# Patient Record
Sex: Male | Born: 1995 | Race: White | Hispanic: No | Smoking: Current every day smoker
Health system: Southern US, Community
[De-identification: ages and names within clinical notes are randomized; demographics above are authoritative.]

## PROBLEM LIST (undated history)

## (undated) DIAGNOSIS — G40909 Epilepsy, unspecified, not intractable, without status epilepticus: Secondary | ICD-10-CM

## (undated) DIAGNOSIS — Z720 Tobacco use: Secondary | ICD-10-CM

## (undated) HISTORY — PX: SHOULDER SURGERY: SHX246

## (undated) HISTORY — PX: KNEE SURGERY: SHX244

---

## 2020-10-17 DIAGNOSIS — C9210 Chronic myeloid leukemia, BCR/ABL-positive, not having achieved remission: Secondary | ICD-10-CM | POA: Insufficient documentation

## 2020-11-18 DIAGNOSIS — G40909 Epilepsy, unspecified, not intractable, without status epilepticus: Secondary | ICD-10-CM

## 2020-11-18 DIAGNOSIS — R9089 Other abnormal findings on diagnostic imaging of central nervous system: Secondary | ICD-10-CM | POA: Insufficient documentation

## 2021-04-01 ENCOUNTER — Other Ambulatory Visit: Payer: Self-pay

## 2021-04-01 ENCOUNTER — Emergency Department (HOSPITAL_COMMUNITY): Payer: 59

## 2021-04-01 ENCOUNTER — Encounter (HOSPITAL_COMMUNITY): Payer: Self-pay | Admitting: Adult Health

## 2021-04-01 ENCOUNTER — Observation Stay (HOSPITAL_COMMUNITY)
Admission: EM | Admit: 2021-04-01 | Discharge: 2021-04-01 | Disposition: A | Discharge status: Home / Self Care | Payer: 59 | Attending: Internal Medicine | Admitting: Internal Medicine

## 2021-04-01 DIAGNOSIS — R569 Unspecified convulsions: Secondary | ICD-10-CM | POA: Diagnosis present

## 2021-04-01 DIAGNOSIS — Z79899 Other long term (current) drug therapy: Secondary | ICD-10-CM | POA: Diagnosis not present

## 2021-04-01 DIAGNOSIS — Z20822 Contact with and (suspected) exposure to covid-19: Secondary | ICD-10-CM | POA: Diagnosis not present

## 2021-04-01 DIAGNOSIS — G40909 Epilepsy, unspecified, not intractable, without status epilepticus: Secondary | ICD-10-CM | POA: Diagnosis not present

## 2021-04-01 DIAGNOSIS — E872 Acidosis, unspecified: Secondary | ICD-10-CM | POA: Insufficient documentation

## 2021-04-01 DIAGNOSIS — Z880 Allergy status to penicillin: Secondary | ICD-10-CM | POA: Diagnosis not present

## 2021-04-01 DIAGNOSIS — N179 Acute kidney failure, unspecified: Secondary | ICD-10-CM | POA: Diagnosis not present

## 2021-04-01 DIAGNOSIS — Y900 Blood alcohol level of less than 20 mg/100 ml: Secondary | ICD-10-CM | POA: Diagnosis not present

## 2021-04-01 DIAGNOSIS — R739 Hyperglycemia, unspecified: Secondary | ICD-10-CM | POA: Insufficient documentation

## 2021-04-01 DIAGNOSIS — G40901 Epilepsy, unspecified, not intractable, with status epilepticus: Secondary | ICD-10-CM | POA: Diagnosis not present

## 2021-04-01 DIAGNOSIS — C9210 Chronic myeloid leukemia, BCR/ABL-positive, not having achieved remission: Secondary | ICD-10-CM | POA: Diagnosis not present

## 2021-04-01 DIAGNOSIS — J9601 Acute respiratory failure with hypoxia: Secondary | ICD-10-CM | POA: Diagnosis not present

## 2021-04-01 DIAGNOSIS — Z886 Allergy status to analgesic agent status: Secondary | ICD-10-CM | POA: Insufficient documentation

## 2021-04-01 DIAGNOSIS — J9600 Acute respiratory failure, unspecified whether with hypoxia or hypercapnia: Secondary | ICD-10-CM

## 2021-04-01 HISTORY — DX: Tobacco use: Z72.0

## 2021-04-01 HISTORY — DX: Epilepsy, unspecified, not intractable, without status epilepticus: G40.909

## 2021-04-01 LAB — CBC WITH DIFFERENTIAL/PLATELET
Abs Immature Granulocytes: 0.52 10*3/uL — ABNORMAL HIGH (ref 0.00–0.07)
Basophils Absolute: 0.1 10*3/uL (ref 0.0–0.1)
Basophils Relative: 1 %
Eosinophils Absolute: 0.3 10*3/uL (ref 0.0–0.5)
Eosinophils Relative: 2 %
HCT: 43.5 % (ref 39.0–52.0)
Hemoglobin: 13.9 g/dL (ref 13.0–17.0)
Immature Granulocytes: 3 %
Lymphocytes Relative: 39 %
Lymphs Abs: 6.9 10*3/uL — ABNORMAL HIGH (ref 0.7–4.0)
MCH: 32.6 pg (ref 26.0–34.0)
MCHC: 32 g/dL (ref 30.0–36.0)
MCV: 102.1 fL — ABNORMAL HIGH (ref 80.0–100.0)
Monocytes Absolute: 1.1 10*3/uL — ABNORMAL HIGH (ref 0.1–1.0)
Monocytes Relative: 6 %
Neutro Abs: 8.6 10*3/uL — ABNORMAL HIGH (ref 1.7–7.7)
Neutrophils Relative %: 49 %
Platelets: 314 10*3/uL (ref 150–400)
RBC: 4.26 MIL/uL (ref 4.22–5.81)
RDW: 13 % (ref 11.5–15.5)
WBC: 17.5 10*3/uL — ABNORMAL HIGH (ref 4.0–10.5)
nRBC: 0 % (ref 0.0–0.2)

## 2021-04-01 LAB — COMPREHENSIVE METABOLIC PANEL
ALT: 20 U/L (ref 0–44)
AST: 34 U/L (ref 15–41)
Albumin: 4.7 g/dL (ref 3.5–5.0)
Alkaline Phosphatase: 68 U/L (ref 38–126)
Anion gap: 21 — ABNORMAL HIGH (ref 5–15)
BUN: 9 mg/dL (ref 6–20)
CO2: 14 mmol/L — ABNORMAL LOW (ref 22–32)
Calcium: 9 mg/dL (ref 8.9–10.3)
Chloride: 101 mmol/L (ref 98–111)
Creatinine, Ser: 1.47 mg/dL — ABNORMAL HIGH (ref 0.61–1.24)
GFR, Estimated: >60 mL/min (ref >60)
Glucose, Bld: 260 mg/dL — ABNORMAL HIGH (ref 70–99)
Potassium: 4.2 mmol/L (ref 3.5–5.1)
Sodium: 136 mmol/L (ref 135–145)
Total Bilirubin: 0.4 mg/dL (ref 0.3–1.2)
Total Protein: 7.5 g/dL (ref 6.5–8.1)

## 2021-04-01 LAB — I-STAT ARTERIAL BLOOD GAS, ED
Acid-base deficit: 3 mmol/L — ABNORMAL HIGH (ref 0.0–2.0)
Bicarbonate: 23.9 mmol/L (ref 20.0–28.0)
Calcium, Ion: 1.2 mmol/L (ref 1.15–1.40)
HCT: 37 % — ABNORMAL LOW (ref 39.0–52.0)
Hemoglobin: 12.6 g/dL — ABNORMAL LOW (ref 13.0–17.0)
O2 Saturation: 100 %
Patient temperature: 98.5
Potassium: 4.3 mmol/L (ref 3.5–5.1)
Sodium: 139 mmol/L (ref 135–145)
TCO2: 25 mmol/L (ref 22–32)
pCO2 arterial: 49.1 mmHg — ABNORMAL HIGH (ref 32.0–48.0)
pH, Arterial: 7.295 — ABNORMAL LOW (ref 7.350–7.450)
pO2, Arterial: 530 mmHg — ABNORMAL HIGH (ref 83.0–108.0)

## 2021-04-01 LAB — LACTIC ACID, PLASMA
Lactic Acid, Venous: >11 mmol/L (ref 0.5–1.9)
Lactic Acid, Venous: 5.3 mmol/L (ref 0.5–1.9)

## 2021-04-01 LAB — RAPID URINE DRUG SCREEN, HOSP PERFORMED
Amphetamines: NOT DETECTED
Barbiturates: NOT DETECTED
Benzodiazepines: POSITIVE — AB
Cocaine: NOT DETECTED
Opiates: NOT DETECTED
Tetrahydrocannabinol: POSITIVE — AB

## 2021-04-01 LAB — I-STAT CHEM 8, ED
BUN: 10 mg/dL (ref 6–20)
Calcium, Ion: 1.15 mmol/L (ref 1.15–1.40)
Chloride: 105 mmol/L (ref 98–111)
Creatinine, Ser: 1.2 mg/dL (ref 0.61–1.24)
Glucose, Bld: 254 mg/dL — ABNORMAL HIGH (ref 70–99)
HCT: 42 % (ref 39.0–52.0)
Hemoglobin: 14.3 g/dL (ref 13.0–17.0)
Potassium: 4.2 mmol/L (ref 3.5–5.1)
Sodium: 138 mmol/L (ref 135–145)
TCO2: 16 mmol/L — ABNORMAL LOW (ref 22–32)

## 2021-04-01 LAB — VITAMIN B12: Vitamin B-12: 274 pg/mL (ref 180–914)

## 2021-04-01 LAB — PHOSPHORUS: Phosphorus: 7 mg/dL — ABNORMAL HIGH (ref 2.5–4.6)

## 2021-04-01 LAB — PHENYTOIN LEVEL, TOTAL: Phenytoin Lvl: <2.5 ug/mL — ABNORMAL LOW (ref 10.0–20.0)

## 2021-04-01 LAB — MAGNESIUM: Magnesium: 2.2 mg/dL (ref 1.7–2.4)

## 2021-04-01 LAB — CBG MONITORING, ED: Glucose-Capillary: 223 mg/dL — ABNORMAL HIGH (ref 70–99)

## 2021-04-01 LAB — RESP PANEL BY RT-PCR (FLU A&B, COVID) ARPGX2
Influenza A by PCR: NEGATIVE
Influenza B by PCR: NEGATIVE
SARS Coronavirus 2 by RT PCR: NEGATIVE

## 2021-04-01 LAB — CK: Total CK: 258 U/L (ref 49–397)

## 2021-04-01 LAB — ETHANOL: Alcohol, Ethyl (B): <10 mg/dL (ref <10)

## 2021-04-01 LAB — VALPROIC ACID LEVEL: Valproic Acid Lvl: <10 ug/mL — ABNORMAL LOW (ref 50.0–100.0)

## 2021-04-01 MED ORDER — PROPOFOL 1000 MG/100ML IV EMUL
0.0000 ug/kg/min | INTRAVENOUS | Status: DC
Start: 2021-04-01 — End: 2021-04-01
  Administered 2021-04-01: 30 ug/kg/min via INTRAVENOUS
  Filled 2021-04-01: qty 100

## 2021-04-01 MED ORDER — SODIUM CHLORIDE 0.9 % IV SOLN
4000.0000 mg | Freq: Once | INTRAVENOUS | Status: AC
  Administered 2021-04-01: 4000 mg via INTRAVENOUS
  Filled 2021-04-01: qty 40

## 2021-04-01 MED ORDER — VITAMIN B-12 1000 MCG PO TABS: 1000.0000 ug | ORAL_TABLET | Freq: Every day | ORAL | Status: DC

## 2021-04-01 MED ORDER — SODIUM CHLORIDE 0.9 % IV SOLN
INTRAVENOUS | Status: DC
  Administered 2021-04-01: 1000 mL via INTRAVENOUS

## 2021-04-01 MED ORDER — HEPARIN SODIUM (PORCINE) 5000 UNIT/ML IJ SOLN: 5000.0000 [IU] | Freq: Three times a day (TID) | INTRAMUSCULAR | Status: DC

## 2021-04-01 MED ORDER — LEVETIRACETAM 500 MG PO TABS: 1500.0000 mg | ORAL_TABLET | Freq: Two times a day (BID) | ORAL | Status: DC

## 2021-04-01 MED ORDER — FENTANYL 2500MCG IN NS 250ML (10MCG/ML) PREMIX INFUSION
0.0000 ug/h | INTRAVENOUS | Status: DC
  Administered 2021-04-01: 50 ug/h via INTRAVENOUS
  Filled 2021-04-01: qty 250

## 2021-04-01 MED ORDER — LEVETIRACETAM IN NACL 1000 MG/100ML IV SOLN: 1000.0000 mg | Freq: Two times a day (BID) | INTRAVENOUS | Status: DC

## 2021-04-01 MED ORDER — ACETAMINOPHEN 325 MG PO TABS: 650.0000 mg | ORAL_TABLET | Freq: Four times a day (QID) | ORAL | Status: DC | PRN

## 2021-04-01 MED ORDER — FENTANYL CITRATE (PF) 100 MCG/2ML IJ SOLN
100.0000 ug | Freq: Once | INTRAMUSCULAR | Status: AC
Start: 2021-04-01 — End: 2021-04-01
  Administered 2021-04-01: 100 ug via INTRAVENOUS
  Filled 2021-04-01: qty 2

## 2021-04-01 MED ORDER — LORAZEPAM 2 MG/ML PO CONC: 4.0000 mg | Freq: Once | ORAL | Status: DC | PRN

## 2021-04-01 MED ORDER — ROCURONIUM BROMIDE 50 MG/5ML IV SOLN
INTRAVENOUS | Status: AC | PRN
  Administered 2021-04-01: 80 mg via INTRAVENOUS

## 2021-04-01 MED ORDER — SODIUM CHLORIDE 0.9 % IV BOLUS
1000.0000 mL | Freq: Once | INTRAVENOUS | Status: AC
  Administered 2021-04-01: 1000 mL via INTRAVENOUS

## 2021-04-01 MED ORDER — ACETAMINOPHEN 650 MG RE SUPP: 650.0000 mg | Freq: Four times a day (QID) | RECTAL | Status: DC | PRN

## 2021-04-01 MED ORDER — PROPOFOL 1000 MG/100ML IV EMUL
INTRAVENOUS | Status: DC | PRN
  Administered 2021-04-01: 30 ug/kg/min via INTRAVENOUS

## 2021-04-01 MED ORDER — ETOMIDATE 2 MG/ML IV SOLN
INTRAVENOUS | Status: AC | PRN
  Administered 2021-04-01: 20 mg via INTRAVENOUS

## 2021-04-01 MED ORDER — DEXMEDETOMIDINE HCL IN NACL 400 MCG/100ML IV SOLN
0.0000 ug/kg/h | INTRAVENOUS | Status: DC
  Administered 2021-04-01: 0.4 ug/kg/h via INTRAVENOUS
  Filled 2021-04-01: qty 100

## 2021-04-01 MED ORDER — THIAMINE HCL 100 MG/ML IJ SOLN
Freq: Once | INTRAVENOUS | Status: AC
  Filled 2021-04-01: qty 1000

## 2021-04-01 MED ORDER — LEVETIRACETAM 750 MG PO TABS: 1500.0000 mg | ORAL_TABLET | Freq: Two times a day (BID) | ORAL | 0 refills | Status: AC

## 2021-04-01 NOTE — Progress Notes (Signed)
Patient intubated by ED MD without complications.  Positive color change noted.  Bilateral breath sounds auscultated.  Chest xray pended for tube placement.  Sats and vitals currently stable.

## 2021-04-01 NOTE — ED Provider Notes (Signed)
[facility] EMERGENCY DEPARTMENT Provider Note   CSN: [id number] Arrival date & time: 04/01/21  4462  LEVEL 5 CAVEAT - UNRESPONSIVE  History Chief Complaint  Patient presents with  . Seizures    Bobby Mathis is a 25 y.o. male.  HPI 25 year old male presents via EMS with seizures.  History is from EMS only as the patient is unresponsive.  He works for [employer] but is from out of town.  He has a known seizure disorder but it is unclear what medicine he is on.  Has been seizing prior to EMS arrival for about 10 minutes and then another 10 minutes with EMS.  After 2 doses of 5 mg Versed he stopped seizing.  However then he started having further seizures and got another 5 mg.  He is no longer seizing but is being bagged.  He has been completely unresponsive since.  EMS describes as a generalized tonic-clonic seizure.  No obvious focality.  Past Medical History:  Diagnosis Date  . Seizure disorder ([place])   . Tobacco abuse     Patient Active Problem List   Diagnosis Date Noted  . Hyperglycemia 04/01/2021  . AKI (acute kidney injury) ([place]) 04/01/2021  . Metabolic acidosis 86/38/1771  . Status epilepticus ([place]) 04/01/2021  . Abnormal brain CT 11/18/2020  . Seizure disorder ([place]) 11/18/2020  . 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 No pertinent family history.     Home Medications Prior to Admission medications   Medication Sig Start Date End Date Taking? Authorizing Provider  levETIRAcetam (KEPPRA) 1000 MG tablet Take 1,000 mg by mouth in the morning and at bedtime.   Yes [provider]    Allergies    Penicillins and Amoxicillin-pot clavulanate  Review of Systems   Review of Systems  Unable to perform ROS: Patient unresponsive    Physical Exam Updated Vital Signs BP 115/69   Pulse 68   Temp 99.1 F (37.3 C)   Resp 18   Ht 5\' 9"  (1.753 m)   Wt 80 kg   SpO2 100%   BMI 26.05 kg/m   Physical  Exam Vitals and nursing note reviewed.  Constitutional:      Appearance: He is well-developed.  HENT:     Head: Normocephalic and atraumatic.     Right Ear: External ear normal.     Left Ear: External ear normal.     Nose: Nose normal.     Mouth/Throat:     Comments: Evidence of distal tongue injury Eyes:     General:        Right eye: No discharge.        Left eye: No discharge.     Pupils: Pupils are equal, round, and reactive to light.  Neck:     Comments: Passive ROM without stiffness Cardiovascular:     Rate and Rhythm: Regular rhythm. Tachycardia present.     Heart sounds: Normal heart sounds.  Pulmonary:     Comments: Being bagged by EMS. Coarse breath sounds bilaterally Abdominal:     General: There is no distension.     Palpations: Abdomen is soft.  Musculoskeletal:     Cervical back: Neck supple. No rigidity.  Skin:    General: Skin is warm and dry.  Neurological:     Mental Status: He is unresponsive.     Comments: Unresponsive. Being bagged by EMS. I cannot get any significant movement to pain  in extremities. No significant spontaneous movement. Does have intact corneal reflex. Has oral airway in with no gag currently.   Psychiatric:        Mood and Affect: Mood is not anxious.     ED Results / Procedures / Treatments   Labs (all labs ordered are listed, but only abnormal results are displayed) Labs Reviewed  LACTIC ACID, PLASMA - Abnormal; Notable for the following components:      Result Value   Lactic Acid, Venous >11.0 (*)    All other components within normal limits  LACTIC ACID, PLASMA - Abnormal; Notable for the following components:   Lactic Acid, Venous 5.3 (*)    All other components within normal limits  CBC WITH DIFFERENTIAL/PLATELET - Abnormal; Notable for the following components:   WBC 17.5 (*)    MCV 102.1 (*)    Neutro Abs 8.6 (*)    Lymphs Abs 6.9 (*)    Monocytes Absolute 1.1 (*)    Abs Immature Granulocytes 0.52 (*)    All other  components within normal limits  COMPREHENSIVE METABOLIC PANEL - Abnormal; Notable for the following components:   CO2 14 (*)    Glucose, Bld 260 (*)    Creatinine, Ser 1.47 (*)    Anion gap 21 (*)    All other components within normal limits  RAPID URINE DRUG SCREEN, HOSP PERFORMED - Abnormal; Notable for the following components:   Benzodiazepines POSITIVE (*)    Tetrahydrocannabinol POSITIVE (*)    All other components within normal limits  PHOSPHORUS - Abnormal; Notable for the following components:   Phosphorus 7.0 (*)    All other components within normal limits  PHENYTOIN LEVEL, TOTAL - Abnormal; Notable for the following components:   Phenytoin Lvl <2.5 (*)    All other components within normal limits  VALPROIC ACID LEVEL - Abnormal; Notable for the following components:   Valproic Acid Lvl <10 (*)    All other components within normal limits  I-STAT CHEM 8, ED - Abnormal; Notable for the following components:   Glucose, Bld 254 (*)    TCO2 16 (*)    All other components within normal limits  CBG MONITORING, ED - Abnormal; Notable for the following components:   Glucose-Capillary 223 (*)    All other components within normal limits  I-STAT ARTERIAL BLOOD GAS, ED - Abnormal; Notable for the following components:   pH, Arterial 7.295 (*)    pCO2 arterial 49.1 (*)    pO2, Arterial 530 (*)    Acid-base deficit 3.0 (*)    HCT 37.0 (*)    Hemoglobin 12.6 (*)    All other components within normal limits  RESP PANEL BY RT-PCR (FLU A&B, COVID) ARPGX2  ETHANOL  MAGNESIUM  CK  VITAMIN B12  LEVETIRACETAM LEVEL  URINALYSIS, ROUTINE W REFLEX MICROSCOPIC  BLOOD GAS, ARTERIAL  HIV ANTIBODY (ROUTINE TESTING W REFLEX)  TSH  T4, FREE    EKG None  Radiology CT Head Wo Contrast  Result Date: 04/01/2021 CLINICAL DATA:  Seizure.  Endotracheal intubation. EXAM: CT HEAD WITHOUT CONTRAST TECHNIQUE: Contiguous axial images were obtained from the base of the skull through the vertex  without intravenous contrast. COMPARISON:  None. FINDINGS: Brain: No abnormality affects the brainstem or cerebellum. The right cerebral hemisphere is normal. Within the junction of the occipital lobe and the posteromedial temporal lobe on the left, immediately adjacent to the occipital horn of the left lateral ventricle, there is a cystic and solid mass with  coarse calcifications, measuring approximately 2 cm in diameter. This is almost certainly the cause of the seizure in this case. 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 No evidence of hemorrhage, hydrocephalus or extra-axial collection Vascular: No abnormal vascular finding. Skull: Normal Sinuses/Orbits: Clear Other: None IMPRESSION: Approximately 2 cm in diameter cystic and solid mass with coarse calcifications at the junction of the left posterior temporal lobe and occipital lobe. 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 This could certainly be a cause of seizure. MRI with contrast recommended for further characterization Electronically Signed   By: Nelson Chimes M.D.   On: 04/01/2021 11:19   DG Chest Portable 1 View  Result Date: 04/01/2021 CLINICAL DATA:  Intubation, post seizure EXAM: PORTABLE CHEST 1 VIEW COMPARISON:  Portable exam 0946 hours without priors for comparison FINDINGS: Tip of endotracheal tube projects 3.6 cm above carina. Nasogastric tube extends into stomach. Normal heart size, mediastinal contours, and pulmonary vascularity. Lungs clear. No pulmonary infiltrate, pleural effusion, pneumothorax, or osseous abnormality. IMPRESSION: No acute abnormalities. Electronically Signed   By: Lavonia Dana M.D.   On: 04/01/2021 09:57   EEG adult  Result Date: 04/01/2021 Lora Havens, MD     04/01/2021 11:46 AM Patient Name: Bobby Mathis MRN: [id number] Epilepsy Attending: Lora Havens Referring Physician/Provider: Dr Lesleigh Noe Date: 04/01/2021 Duration: 24.58 mins  Patient history: 25 yo patient with known seizure disorder presenting with status epilepticus. EEG to evaluate for seizure. Level of alertness:  comatose AEDs during EEG study: propofol Technical aspects: This EEG study was done with scalp electrodes positioned according to the 10-20 International system of electrode placement. Electrical activity was acquired at a sampling rate of 500Hz  and reviewed with a high frequency filter of 70Hz  and a low frequency filter of 1Hz . EEG data were recorded continuously and digitally stored. Description: EEG showed continuous generalized 3 to 6 Hz theta-delta slowing admixed with 15-18Hz  generalized beta activity. Spikes were noted in left frontotemporal region. Hyperventilation and photic stimulation were not performed.   ABNORMALITY -Spike, left frontotemporal region - Continuous slow, generalized IMPRESSION: This study showed evidence of epileptogenicity arising from left frontotemporal region. Additionally, there is severe diffuse encephalopathy, nonspecific etiology but likely related to sedation. No seizures were seen throughout the recording. Lora Havens    Procedures Procedure Name: Intubation Date/Time: 04/01/2021 10:01 AM Performed by: Sherwood Gambler, MD Pre-anesthesia Checklist: Patient identified, Patient being monitored, Emergency Drugs available, Timeout performed and Suction available Oxygen Delivery Method: Non-rebreather mask Preoxygenation: Pre-oxygenation with 100% oxygen Induction Type: Rapid sequence Ventilation: Mask ventilation without difficulty Laryngoscope Size: Glidescope and 4 Grade View: Grade II Tube size: 8.0 mm Number of attempts: 1 Airway Equipment and Method: Video-laryngoscopy Placement Confirmation: ETT inserted through vocal cords under direct vision,  CO2 detector and Breath sounds checked- equal and bilateral Secured at: 26 cm Tube secured with: ETT holder Dental Injury: Teeth and Oropharynx as per pre-operative  assessment      .Critical Care Performed by: Sherwood Gambler, MD Authorized by: Sherwood Gambler, MD   Critical care provider statement:    Critical care time (minutes):  60   Critical care time was exclusive of:  Separately billable procedures and treating other patients   Critical care was necessary to treat or prevent imminent or life-threatening deterioration of the following conditions:  CNS failure or compromise   Critical care was time spent personally by me on the following activities:  Discussions with consultants, evaluation of patient's response to  treatment, examination of patient, ordering and performing treatments and interventions, ordering and review of laboratory studies, ordering and review of radiographic studies, pulse oximetry, re-evaluation of patient's condition, obtaining history from patient or surrogate and review of old charts     Medications Ordered in ED Medications  dexmedetomidine (PRECEDEX) 400 MCG/100ML (4 mcg/mL) infusion (0 mcg/kg/hr  80 kg Intravenous Stopped 04/01/21 1345)  levETIRAcetam (KEPPRA) IVPB 1000 mg/100 mL premix (has no administration in time range)  vitamin B-12 (CYANOCOBALAMIN) tablet 1,000 mcg (has no administration in time range)  0.9 %  sodium chloride infusion (1,000 mLs Intravenous New Bag/Given 04/01/21 1358)  heparin injection 5,000 Units (has no administration in time range)  sodium chloride 0.9 % 1,000 mL with thiamine 500 mg, folic acid 1 mg, multivitamins adult 10 mL infusion (has no administration in time range)  acetaminophen (TYLENOL) tablet 650 mg (has no administration in time range)    Or  acetaminophen (TYLENOL) suppository 650 mg (has no administration in time range)  etomidate (AMIDATE) injection (20 mg Intravenous Given 04/01/21 0931)  rocuronium (ZEMURON) injection (80 mg Intravenous Given 04/01/21 0932)  sodium chloride 0.9 % bolus 1,000 mL (0 mLs Intravenous Stopped 04/01/21 1025)  levETIRAcetam (KEPPRA) 4,000 mg in  sodium chloride 0.9 % 250 mL IVPB (0 mg Intravenous Stopped 04/01/21 1037)  fentaNYL (SUBLIMAZE) injection 100 mcg (100 mcg Intravenous Given 04/01/21 1111)    ED Course  I have reviewed the triage vital signs and the nursing notes.  Pertinent labs & imaging results that were available during my care of the patient were reviewed by me and considered in my medical decision making (see chart for details).    MDM Rules/Calculators/A&P                          Patient was having ventilations assisted on arrival and was unclear if he was having subclinical status.  He was intubated as above.  CT head without obvious new findings.  Labs showed leukocytosis which is not surprising given the status epilepticus as well as the high lactate.  Otherwise his vitals have normalized after being on sedation.  ICU has been consulted.  Neurology was helping assist and recommended Keppra load.  Now his EEG is not showing any seizure-like activity and he starting to wake up.  Is requiring higher doses of propofol.  ICU decided to wean off sedation and then patient basically extubated himself.  He was agitated after this and still acting postictal and was put on Precedex.  Now he is calm and came off Precedex and is resting comfortably.  Neuro will follow but this point I think he will need observation and titration of his seizure meds.  MRI with and without for his mass that is both known about but also possibly causing his seizures.  I do not think he has a CNS infection and needs LP.  Admit to hospitalist service. Final Clinical Impression(s) / ED Diagnoses Final diagnoses:  Status epilepticus ([place])  Acute respiratory failure, unspecified whether with hypoxia or hypercapnia ([place])    Rx / DC Orders ED Discharge Orders    None       Sherwood Gambler, MD 04/01/21 1500

## 2021-04-01 NOTE — Consult Note (Signed)
NAME:  Bobby Mathis, MRN:  [id number], DOB:  10-16-1996, LOS: 0 ADMISSION DATE:  04/01/2021, CONSULTATION DATE:  5/31 REFERRING MD:  EDP, CHIEF COMPLAINT:  Status epilepticus   History of Present Illness:  25yo male with hx seizures, L parietal temporal cyst who presented 5/31 after multiple witnessed seizures while at work. Initial seizure lasted ~2mins, stopped with 5mg  versed x2, however he then started having further seizures and received an additional 5mg  versed.  He was intubated on arrival to ED and was placed on propofol and fentanyl. Neurology was consulted and obtained spot EEG which was negative. Sedation was then weaned and pt was successfully extubated.  After extubation, he was agitated and was started on Precedex temporarily in hopes that he would settle out and Precedex could be weaned off.  Precedex has since been stopped and he has remained seizure free.  Pertinent  Medical History  left parietal temporal cyst  history of generalized tonic-clonic seizures  Significant Hospital Events: Including procedures, antibiotic start and stop dates in addition to other pertinent events   EEG 5/31>>> This study showed evidence of epileptogenicity arising from left frontotemporal region. Additionally, there is severe diffuse encephalopathy, nonspecific etiology but likely related to sedation. No seizures were seen throughout the recording. CT head 5/31>>> Approximately 2 cm in diameter cystic and solid mass with coarse calcifications at the junction of the left posterior temporal lobe and occipital lobe. 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 This could certainly be a cause of seizure MRI brain 5/31 >   Interim History / Subjective:  Extubated successfully.  Started on Precedex which has been weaned and has now been turned off.  He has remained stable since Precedex was stopped and has not had seizure recurrence.  Objective   Blood pressure 109/70, pulse 64, temperature 99.1  F (37.3 C), resp. rate (!) 24, height 5\' 9"  (1.753 m), weight 80 kg, SpO2 100 %.    Vent Mode: PRVC FiO2 (%):  [100 %] 100 % Set Rate:  [20 bmp] 20 bmp Vt Set:  [560 mL] 560 mL PEEP:  [5 cmH20] 5 cmH20 Plateau Pressure:  [13 cmH20] 13 cmH20   Intake/Output Summary (Last 24 hours) at 04/01/2021 1210 Last data filed at 04/01/2021 1037 Gross per 24 hour  Intake 1250 ml  Output --  Net 1250 ml   Filed Weights   04/01/21 0925  Weight: 80 kg    Examination: General: Young adult male, extubated a few moments ago, in NAD. Neuro: Awake, following commands, answering questions. HEENT: [place]/AT. Sclerae anicteric. EOMI. Cardiovascular: RRR, no M/R/G.  Lungs: Respirations even and unlabored.  CTA bilaterally, No W/R/R. Abdomen: BS x 4, soft, NT/ND.  Musculoskeletal: No gross deformities, no edema.  Skin: Intact, warm, no rashes.   Resolved Hospital Problem list     Assessment & Plan:   Status epilepticus - in setting known seizure disorder.  S/p 4g loading dose of Keppra. Known L parietal cyst. - Per neurology. - F/u MRI. - AED's per neuro. - F/u as outpatient with neurologist at [facility].  Acute respiratory failure - r/t status epilepticus.  Required intubation initially but has since been extubated successfully. - No further interventions required.  Hyperglycemia. - SSI if glucose consistently > 180.  Hx CML. - F/u as outpatient at [facility].   No CCM / ICU needs at this time.  OK for [facility] admission to SDU with neuro managing seizures.  Please call if any additional CCM needs arise.    Labs  CBC: Recent Labs  Lab 04/01/21 0934 04/01/21 0950 04/01/21 1029  WBC 17.5*  --   --   NEUTROABS 8.6*  --   --   HGB 13.9 14.3 12.6*  HCT 43.5 42.0 37.0*  MCV 102.1*  --   --   PLT 314  --   --     Basic Metabolic Panel: Recent Labs  Lab 04/01/21 0934 04/01/21 0950 04/01/21 1029  NA 136 138 139  K 4.2 4.2 4.3  CL 101 105  --   CO2 14*  --   --   GLUCOSE 260* 254*   --   BUN 9 10  --   CREATININE 1.47* 1.20  --   CALCIUM 9.0  --   --   MG 2.2  --   --   PHOS 7.0*  --   --    GFR: Estimated Creatinine Clearance: 94.1 mL/min (by C-G formula based on SCr of 1.2 mg/dL). Recent Labs  Lab 04/01/21 0934 04/01/21 1100  WBC 17.5*  --   LATICACIDVEN >11.0* 5.3*    Liver Function Tests: Recent Labs  Lab 04/01/21 0934  AST 34  ALT 20  ALKPHOS 68  BILITOT 0.4  PROT 7.5  ALBUMIN 4.7   No results for input(s): LIPASE, AMYLASE in the last 168 hours. No results for input(s): AMMONIA in the last 168 hours.  ABG    Component Value Date/Time   PHART 7.295 (L) 04/01/2021 1029   PCO2ART 49.1 (H) 04/01/2021 1029   PO2ART 530 (H) 04/01/2021 1029   HCO3 23.9 04/01/2021 1029   TCO2 25 04/01/2021 1029   ACIDBASEDEF 3.0 (H) 04/01/2021 1029   O2SAT 100.0 04/01/2021 1029     Coagulation Profile: No results for input(s): INR, PROTIME in the last 168 hours.  Cardiac Enzymes: Recent Labs  Lab 04/01/21 0934  CKTOTAL 258    HbA1C: No results found for: HGBA1C  CBG: Recent Labs  Lab 04/01/21 0935  GLUCAP 223*    Review of Systems:   Unable, pt sedated on vent. As per HPI above obtained from records and staff   Past Medical History:  He,  has no past medical history on file.   Surgical History:  History reviewed. No pertinent surgical history.   Social History:      Family History:  His family history is not on file.   Allergies Not on File   Home Medications  Prior to Admission medications   Not on File     Critical care time: 40 min.    Montey Hora, [facility] For pager details, please see AMION or use Epic chat  After 1900, please call [facility] for cross coverage needs 04/01/2021, 1:37 PM

## 2021-04-01 NOTE — Consult Note (Addendum)
Neurology Consultation Reason for Consult: Status epilepticus Requesting Physician: Dr. Sherwood Gambler  CC: Status epilepticus   History is obtained from: EMS and chart review due to patient's AMS  HPI: Bobby Mathis is a 25 y.o. right-handed man with a past medical history significant for multiple concussions (football/falls), left parietal temporal cyst and history of generalized tonic-clonic seizures.  Per EMS, he is a Administrator and from out of town.  He had witnessed seizure activity by coworkers lasting 10 minutes, and responded to 10 mg midazolam IM.  However he then had another seizure without return to baseline and received another 5 mg of midazolam IV, after which he was not responsive and required bagging.  There was no focality to the event on EMS evaluation  Last neurology note accessible to me is from 11/19/2017 by Dr. Verner Chol in [facility] [place] [facility].  He was originally seen in the ED for first-time seizure on 09/30/2017 after a generalized tonic-clonic seizure happening while he was asleep but witnessed by his girlfriend.  He had a postictal confusion for about 30 minutes and then returned to baseline.  He has no history of childhood seizures, meningitis or family history of seizures per that note.  He had been started on Keppra and imaging revealed a left parietal cystic lesion adjacent to the left lateral ventricle.  EEG on 09/30/2017 was normal by report and laboratory work-up was notable only for positive THC in the urine tox screen.  Keppra dose was 750 mg twice daily and he was noted to be daily smoker but nondrinker neurological exam was normal  Additional history obtained from the patient's mother.  He had stopped taking Keppra in June 2020 in the guidance of his neurologist in [place].  He moved to live with his parents after breaking up with his girlfriend in April 2021.  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  He is being seen at [facility] for both his seizures as well as is CML and continues on Keppra (unknown dose) as well as Sprycel.  Regarding his current seizure frequency mom is aware that he had seizures been December, January, March, typically in the setting of missed medications.  His seizures are associated with postictal headache, sometimes nausea/vomiting and a postictal period with jumbled/gibberish speech that can last up to several hours, and are felt to be localization related to his known left parietal cyst.  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  Additionally he had gone to the beach over the weekend with family resulting in a sunburn on his abdomen and he was drinking beers (unknown quantity, but likely more than his typical intake) over the weekend.  He was observed to take his Keppra on Saturday but is unclear if he was taking it as prescribed the remainder of the weekend.  He additionally smokes marijuana but there is no concern for other substance use.  He does drink on occasion on the weekends with his friends.  Otherwise he did have a GI bug 2 weeks ago when he was sick for several days but this is resolved and his appetite has been normal.  Mother does think he has lost a few pounds over the last few months.    ROS: Unable to obtain due to altered mental status.   No past medical history on file. -History of seizures,  concussions, marijuana and tobacco use as above, as well as CML  No family history on file. -No family history of seizures per prior notes, confirmed with mom  Social History:  has no history on file for tobacco use, alcohol use, and drug use. -Marijuana and tobacco use per prior notes  Exam: Current vital signs: BP (!) 144/59   Pulse (!) 134   Resp (!) 23   Wt 80 kg   SpO2 100%  Vital signs in last 24 hours: Pulse Rate:  [134] 134  (05/31 0930) Resp:  [23] 23 (05/31 0930) BP: (144)/(59) 144/59 (05/31 0930) SpO2:  [100 %] 100 % (05/31 0930) Weight:  [80 kg] 80 kg (05/31 0925)   Physical Exam  Constitutional: Appears well-developed and well-nourished.  Psych: Unresponsive Eyes: No scleral injection HENT: King airway in place MSK: no joint deformities.  Cardiovascular: Sinus tachycardia on monitor Respiratory: Minimal effort on respirations, requiring bagging GI: Soft.  No distension.  Skin: Warm dry and intact visible skin  Neuro: Mental Status: Obtunded.  Cranial Nerves: II: No blink to threat.  Pupils are equal, round, and reactive to light 4 mm to 2 mm III,IV, VI: Sluggish vestibular ocular reflex bilaterally V/VII: Corneals intact to saline stimulation bilaterally and equal VIII: No response to voice X/IX: Did cough spontaneously once but not reliably  XII: tongue Unable to assess secondary to patient's mental status  Sensory/motor: Normal bulk.  Low tone throughout.   There is a trace flicker of movement in the right upper extremity to noxious stimulation but cannot be fully characterized.  No movement elsewhere. Deep Tendon Reflexes: Toes are mute bilaterally.  Remainder of reflexes were not obtained prior to patient receiving paralytic agent for intubation Cerebellar: Unable to assess secondary to patient's mental status   I have reviewed labs in epic and the results pertinent to this consultation are: Creatinine 1.2 (i-STAT), lactate greater than 11, Sodium 138, potassium 4.2, chloride 105, BUN 10 Glucose 254 Hemoglobin 14.3 Remainder labs pending at this time  I have reviewed the images obtained: Head CT demonstrates known left parietal cyst reported on prior imaging but otherwise there is no acute intracranial abnormality appreciated on my review.  Formal radiology read pending  Impression: Patient with known seizure disorder presenting with status epilepticus.  Etiology possibly in the  setting of sleep disruption and potential medication noncompliance.  However given history of CML, will obtain additional imaging.  We will hold on lumbar puncture for now but if patient is not clinically improving and lab work suggests medication compliance, or MRI has concerning findings, or patient becomes febrile, he will then need a lumbar puncture  Recommendations: -Agree with intubation for airway protection given duration of seizure activity, sedation with propofol -Head CT -4 g Keppra load (60 mg/kg maxed at 4 g) -Stat EEG with long-term monitoring to allow weaning of sedation -Pending labs and clinical course, may require lumbar puncture  Additional recommendations on reevaluation With propofol paused patient is able to make purposeful movements with all 4 extremities, does move the right side a little more briskly than the left.  EEG does not show ongoing seizure activity although does show left-sided irritability.  Lactate is downtrending from 11 to 5.3 CBC notable for macrocytosis B12 274 CMP notable for AKI with creatinine 1.47 (baseline at [facility] recently 0.8-0.9) UDS positive for benzodiazepines (given by EMS) and THC, serum alcohol undetectable COVID/flu negative Keppra level pending   #Status epilepticus -Wean sedation with goal of extubation -EEG may  be discontinued once patient can be extubated with good neurological examination to follow clinically -Continue home Keppra 1000 mg twice daily, will consider increase if level drawn on admission results normal; may need to be adjusted based on renal function Estimated Creatinine Clearance: 94.1 mL/min (by C-G formula based on SCr of 1.2 mg/dL).   CrCl 80 to 130 mL/minute/1.73 m2: 500 mg to 1.5 g every 12 hours.  CrCl 50 to <80 mL/minute/1.73 m2: 500 mg to 1 g every 12 hours.  CrCl 30 to <50 mL/minute/1.73 m2: 250 to 750 mg every 12 hours.  CrCl 15 to <30 mL/minute/1.73 m2: 250 to 500 mg every 12 hours.  CrCl <15  mL/minute/1.73 m2: 250 to 500 mg every 24 hours (expert opinion). -MRI brain with and without contrast given history of CML, when able (ordered) -- may need to await discontinuation of EEG if MRI-safe leads not in place -Appreciate management of ventilator and CML per primary team  #Macrocytosis, borderline low B12 -B12 level checked, given it is low normal in the 200s will start B12 1000 mcg p.o. daily   #AKI -Continue to trend creatinine as well as CK -S/p 1 L normal saline bolus in the ED, started normal saline at 125 cc/h given high risk for rhabdomyolysis; appreciate primary team adjustments of fluids as needed  Lesleigh Noe MD-PhD [facility] [phone]  Total critical care time: 70 minutes   Critical care time was exclusive of separately billable procedures and treating other patients.   Critical care was necessary to treat or prevent imminent or life-threatening deterioration.   Critical care was time spent personally by me on the following activities: development of treatment plan with patient and/or surrogate as well as nursing, discussions with consultants/primary team, evaluation of patient's response to treatment, examination of patient, obtaining history from patient or surrogate, ordering and performing treatments and interventions, ordering and review of laboratory studies, ordering and review of radiographic studies, and re-evaluation of patient's condition as needed, as documented above.

## 2021-04-01 NOTE — ED Notes (Signed)
Pt is on a 24 EEG and unable to complete MRI at this time

## 2021-04-01 NOTE — ED Notes (Signed)
Neurologist and Admitting made aware of pt wanting to leave AMA. I asked if we could stop the EEG and get the MRI if he refuses to stay and get treatment

## 2021-04-01 NOTE — ED Triage Notes (Addendum)
Pt arrives via EMS from work where patient had witnessed seizure ongoing for 10 minutes. Pt received 10mg  versed. Stopped seizing. Restarted seizing, pt unresponsive bagging. 5mg  versed additional given. Second seizure lasted 10 mintutes. Pt unresponsive on arrival. 18g LEJ. Pt with OPA and NPA on arrival. Pt guppy breathing.

## 2021-04-01 NOTE — Procedures (Signed)
Patient Name: Bobby Mathis  MRN: [id number]  Epilepsy Attending: Lora Havens  Referring Physician/Provider: Dr Lesleigh Noe Date: 04/01/2021 Duration: 24.58 mins  Patient history: 25 yo patient with known seizure disorder presenting with status epilepticus. EEG to evaluate for seizure.   Level of alertness:  comatose  AEDs during EEG study: propofol  Technical aspects: This EEG study was done with scalp electrodes positioned according to the 10-20 International system of electrode placement. Electrical activity was acquired at a sampling rate of 500Hz  and reviewed with a high frequency filter of 70Hz  and a low frequency filter of 1Hz . EEG data were recorded continuously and digitally stored.   Description: EEG showed continuous generalized 3 to 6 Hz theta-delta slowing admixed with 15-18Hz  generalized beta activity.  Spikes were noted in left frontotemporal region. Hyperventilation and photic stimulation were not performed.     ABNORMALITY -Spike, left frontotemporal region - Continuous slow, generalized  IMPRESSION: This study showed evidence of epileptogenicity arising from left frontotemporal region. Additionally, there is severe diffuse encephalopathy, nonspecific etiology but likely related to sedation. No seizures were seen throughout the recording.   Bobby Mathis [name]

## 2021-04-01 NOTE — ED Notes (Signed)
Neuro at bedside.

## 2021-04-01 NOTE — Progress Notes (Signed)
LTM EEG hooked up and running - no initial skin breakdown - push button tested - neuro notified.  

## 2021-04-01 NOTE — Care Plan (Signed)
Patient is now awake, alert, and demanding to go home. Face symmetric, EOMI, moving all extremities grossly equally antigravity, sensation grossly intact throughout He reports that he has been compliant with his Keppra with last missed dose sometime in the prior week but no recent missed doses.  Father bedside confirms that they checked his medication pillbox and this report appears accurate.  They confirm he is always very agitated postictally and never agrees to stay for further work-up.  He typically takes 2 to 3 days to gradually normalize his personality and be willing to engage in further medical care. Despite his agitation he is able to explain to me and that leaving the hospital carries risks of further seizures, death.  He is also able to explain he is not allowed to drive, will need to report his seizure to the [facility] and seizures must be controlled for 6 months before he is able to drive again. He is adamant that he will not stay for an MRI or any other further testing and multiple times starts attempt to remove his IVs himself.  He is very agitated when I stop these attempts but does cease to self discontinue his leads and medical equipment.  Given information above I will increase Keppra to 1500 mg twice daily, and order lorazepam liquid 4 mg to be placed in the cheek for seizure ongoing greater than 5 minutes.  This rescue medication was discussed with his father and all of his questions were answered.  Father requested that the medication be called into [facility] in [place] (ZIP Code [phone]).  Father has agreed to arrange close outpatient follow-up with the patient's neurologist and neurosurgeon as well as hematology, with request to repeat MRI brain on an outpatient basis.  We discussed my concerns that there may be some tumor progression given worsening severity/frequency of seizures despite medication compliance.  An additional 40 minutes of care were provided within this  interaction.   Recommendations above communicated to primary team via secure chat.  Lesleigh Noe MD-PhD [facility] [phone] Available 7 AM to 7 PM, outside these hours please contact Neurologist on call listed on AMION

## 2021-04-01 NOTE — H&P (Signed)
History and Physical    Bobby Mathis ZHG:[id number] DOB: 15-Mar-1996 DOA: 04/01/2021  PCP: Gilford Silvius, MD    Patient coming from:  Home   Chief Complaint:  Status epilepticus.   HPI: Bobby Mathis is a 25 y.o. male seen in ed via EMS, pt was brought for witnessed seizure for 10 minutes and received 10 mg versed and had multiple seizure en route to ed. He restarted seizing and was bagged to oxygenate  He was unresponsive, and has OPA.Pt is struck dirver and seizure was witnessed by cowokers. Per neruo note: Last neurology note accessible to me is from 11/19/2017 by Dr. Verner Chol in [facility].  He was originally seen in the ED for first-time seizure on 09/30/2017 after a generalized tonic-clonic seizure happening while he was asleep but witnessed by his girlfriend.  He had a postictal confusion for about 30 minutes and then returned to baseline.  He has no history of childhood seizures, meningitis or family history of seizures per that note.  He had been started on Keppra and imaging revealed a left parietal cystic lesion adjacent to the left lateral ventricle.  EEG on 09/30/2017 was normal by report and laboratory work-up was notable only for positive THC in the urine tox screen.  Keppra dose was 750 mg twice daily and he was noted to be daily smoker but nondrinker neurological exam was normal.He moved to live with his parents after breaking up with his girlfriend in April 2021.  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  He is being seen at [facility] for both his seizures as well as is CML and continues on Keppra (unknown dose) as well as Sprycel.  Regarding his current seizure frequency mom is aware that he had seizures been December, January, March, typically in the setting of missed medications.  His seizures are associated with postictal headache, sometimes nausea/vomiting and a  postictal period with jumbled/gibberish speech that can last up to several hours, and are felt to be localization related to his known left parietal cyst. 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 He has not stopped taking keppra he just misses doses sometimes. No prodrome before seizures.   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   ED Course:  Vitals:   04/01/21 1221 04/01/21 1230 04/01/21 1415 04/01/21 1422  BP:  112/79 115/69   Pulse: 77 79 68   Resp: 17 17 18 18   Temp:      SpO2: 100% 100% 100%   Weight:      Height:      In the emergency room patient initially was altered, was intubated, sedated, darted on a Precedex drip and followed by self extubation.  Patient also evaluated by neurology with recommendations for an MRI.  Patient received loading dose of Keppra 4 g, normal saline 1 L bolus, needs IV fluid with normal saline at 125/h, vitamin B12 tablet x1, BG showed a pH of 7.295, PCO2 49.1, PO2 530 initial CMP shows glucose of 260, creatinine of 1.47, phosphorus of 7.0, GFR of 60, normal LFTs, initial CPK of 12/08/1956, initial lactic of 11.0, repeat lactic of 5.3, CBC shows a leukocytosis with a white count of 17.5 normal hemoglobin of 13.9, MCV of 102.1.   Review of  Systems:  Review of Systems  All other systems reviewed and are negative.    Past Medical History:  Diagnosis Date  . Seizure disorder ([place])   . Tobacco abuse     Past Surgical History:  Procedure Laterality Date  . KNEE SURGERY Left   . SHOULDER SURGERY Left      reports that he has been smoking cigarettes. He has been smoking about 0.50 packs per day. He has never used smokeless tobacco. He reports current alcohol use. He reports current drug use. Drug: Marijuana.  Allergies  Allergen Reactions  . Penicillins Hives and Rash       . Amoxicillin-Pot  Clavulanate Other (See Comments)    Reaction not confirmed    History reviewed. No pertinent family history.  Prior to Admission medications   Medication Sig Start Date End Date Taking? Authorizing Provider  levETIRAcetam (KEPPRA) 1000 MG tablet Take 1,000 mg by mouth in the morning and at bedtime.   Yes [provider]    Physical Exam: Vitals:   04/01/21 1221 04/01/21 1230 04/01/21 1415 04/01/21 1422  BP:  112/79 115/69   Pulse: 77 79 68   Resp: 17 17 18 18   Temp:      SpO2: 100% 100% 100%   Weight:      Height:       Physical Exam Vitals and nursing note reviewed.  Constitutional:      General: He is not in acute distress.    Appearance: Normal appearance. He is not ill-appearing, toxic-appearing or diaphoretic.  HENT:     Head: Normocephalic and atraumatic.     Right Ear: External ear normal.     Left Ear: External ear normal.     Nose: Nose normal.     Mouth/Throat:     Mouth: Mucous membranes are moist.  Eyes:     Extraocular Movements: Extraocular movements intact.     Pupils: Pupils are equal, round, and reactive to light.  Cardiovascular:     Rate and Rhythm: Normal rate and regular rhythm.     Pulses: Normal pulses.     Heart sounds: Normal heart sounds.  Pulmonary:     Effort: Pulmonary effort is normal.     Breath sounds: Normal breath sounds.  Abdominal:     General: Abdomen is flat. Bowel sounds are normal. There is no distension.     Palpations: Abdomen is soft.     Tenderness: There is no abdominal tenderness. There is no guarding.  Musculoskeletal:     Right lower leg: No edema.     Left lower leg: No edema.  Skin:    General: Skin is warm.  Neurological:     General: No focal deficit present.     Mental Status: He is alert and oriented to person, place, and time.     Cranial Nerves: No cranial nerve deficit.  Psychiatric:        Mood and Affect: Mood normal.        Behavior: Behavior normal.     Labs on Admission: I have  personally reviewed following labs and imaging studies  Recent Labs    04/01/21 0934  CKTOTAL 258   Lab Results  Component Value Date   WBC 17.5 (H) 04/01/2021   HGB 12.6 (L) 04/01/2021   HCT 37.0 (L) 04/01/2021   MCV 102.1 (H) 04/01/2021   PLT 314 04/01/2021    Recent Labs  Lab 04/01/21 0934 04/01/21 0950 04/01/21 1029  NA 136 138  139  K 4.2 4.2 4.3  CL 101 105  --   CO2 14*  --   --   BUN 9 10  --   CREATININE 1.47* 1.20  --   CALCIUM 9.0  --   --   PROT 7.5  --   --   BILITOT 0.4  --   --   ALKPHOS 68  --   --   ALT 20  --   --   AST 34  --   --   GLUCOSE 260* 254*  --    No results found for: CHOL, HDL, LDLCALC, TRIG No results found for: DDIMER Invalid input(s): POCBNP  Urinalysis No results found for: COLORURINE, APPEARANCEUR, LABSPEC, PHURINE, GLUCOSEU, HGBUR, BILIRUBINUR, KETONESUR, PROTEINUR, UROBILINOGEN, NITRITE, LEUKOCYTESUR      COVID-19 Labs  No results for input(s): DDIMER, FERRITIN, LDH, CRP in the last 72 hours.  Lab Results  Component Value Date   Kramer NEGATIVE 04/01/2021    Radiological Exams on Admission: CT Head Wo Contrast  Result Date: 04/01/2021 CLINICAL DATA:  Seizure.  Endotracheal intubation. EXAM: CT HEAD WITHOUT CONTRAST TECHNIQUE: Contiguous axial images were obtained from the base of the skull through the vertex without intravenous contrast. COMPARISON:  None. FINDINGS: Brain: No abnormality affects the brainstem or cerebellum. The right cerebral hemisphere is normal. Within the junction of the occipital lobe and the posteromedial temporal lobe on the left, immediately adjacent to the occipital horn of the left lateral ventricle, there is a cystic and solid mass with coarse calcifications, measuring approximately 2 cm in diameter. This is almost certainly the cause of the seizure in this case. 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 No evidence of hemorrhage,  hydrocephalus or extra-axial collection Vascular: No abnormal vascular finding. Skull: Normal Sinuses/Orbits: Clear Other: None IMPRESSION: Approximately 2 cm in diameter cystic and solid mass with coarse calcifications at the junction of the left posterior temporal lobe and occipital lobe. 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 This could certainly be a cause of seizure. MRI with contrast recommended for further characterization Electronically Signed   By: Nelson Chimes M.D.   On: 04/01/2021 11:19   DG Chest Portable 1 View  Result Date: 04/01/2021 CLINICAL DATA:  Intubation, post seizure EXAM: PORTABLE CHEST 1 VIEW COMPARISON:  Portable exam 0946 hours without priors for comparison FINDINGS: Tip of endotracheal tube projects 3.6 cm above carina. Nasogastric tube extends into stomach. Normal heart size, mediastinal contours, and pulmonary vascularity. Lungs clear. No pulmonary infiltrate, pleural effusion, pneumothorax, or osseous abnormality. IMPRESSION: No acute abnormalities. Electronically Signed   By: Lavonia Dana M.D.   On: 04/01/2021 09:57   EEG adult  Result Date: 04/01/2021 Lora Havens, MD     04/01/2021 11:46 AM Patient Name: Bobby Mathis MRN: [id number] Epilepsy Attending: Lora Havens Referring Physician/Provider: Dr Lesleigh Noe Date: 04/01/2021 Duration: 24.58 mins Patient history: 25 yo patient with known seizure disorder presenting with status epilepticus. EEG to evaluate for seizure. Level of alertness:  comatose AEDs during EEG study: propofol Technical aspects: This EEG study was done with scalp electrodes positioned according to the 10-20 International system of electrode placement. Electrical activity was acquired at a sampling rate of 500Hz  and reviewed with a high frequency filter of 70Hz  and a low frequency filter of 1Hz . EEG data were recorded continuously and digitally stored. Description: EEG showed continuous generalized 3 to 6 Hz theta-delta slowing admixed with  15-18Hz  generalized beta activity. Spikes  were noted in left frontotemporal region. Hyperventilation and photic stimulation were not performed.   ABNORMALITY -Spike, left frontotemporal region - Continuous slow, generalized IMPRESSION: This study showed evidence of epileptogenicity arising from left frontotemporal region. Additionally, there is severe diffuse encephalopathy, nonspecific etiology but likely related to sedation. No seizures were seen throughout the recording. Priyanka [name]    EKG: Independently reviewed.  None     Assessment/Plan Principal Problem:   Seizure disorder (HCC) Active Problems:   Status epilepticus (HCC)   CML (chronic myelocytic leukemia) (HCC)   Hyperglycemia   AKI (acute kidney injury) ([place])   Metabolic acidosis   Seizure d/o / status epilepticus: Cont keppra  At 1000 mg bid and seizure precaution.  Neurology consult.  Mri brain pending.   CML: ? History pt to f/u with his specialist.  Hyperglycemia: Will obtain a1c.  AKI: Attribute to dehydration and seizure. Resolved to 1.20 with ivf hydration. Will follow kidney function.   Metabolic acidosis: Due to seizures, and cont ivf hydration.  Expect to resolve with ivf hydration.     DVT prophylaxis:  SCD's   Code Status:  Full Code  Family Communication:  Anddy, Wingert (Mother)  [phone] (Mobile)  Disposition Plan:  Home  Consults called:  Neurology  Admission status: Inpatient.   Para Skeans MD [facility] [phone] How to contact the [facility] Attending or Consulting provider [place] or covering provider during after hours [place], for this patient.    1. Check the care team in [facility] and look for a) attending/consulting [place] provider listed and b) the [facility] team listed 2. Log into [email or web address] and use [place]'s universal password to access. If you do not have the password, please contact the hospital operator. 3. Locate the [facility] provider you are looking for under  [facility] and page to a number that you can be directly reached. 4. If you still have difficulty reaching the provider, please page the [facility] (Director on Call) for the Hospitalists listed on amion for assistance. [email or web address] Password [email or web address] 04/01/2021, 3:02 PM

## 2021-04-01 NOTE — Progress Notes (Signed)
Post intubation ABG results obtained on ventilator settings of VT: 560, RR: 20, FIO2: 100%, and PEEP: 5.  Increased RR to 24 and decreased FIO2 to 40%.  Patient tolerating well at this time.  Will continue to monitor.    Ref. Range 04/01/2021 10:29  Sample type Unknown ARTERIAL  pH, Arterial Latest Ref Range: 7.350 - 7.450  7.295 (L)  pCO2 arterial Latest Ref Range: 32.0 - 48.0 mmHg 49.1 (H)  pO2, Arterial Latest Ref Range: 83.0 - 108.0 mmHg 530 (H)  TCO2 Latest Ref Range: 22 - 32 mmol/L 25  Acid-base deficit Latest Ref Range: 0.0 - 2.0 mmol/L 3.0 (H)  Bicarbonate Latest Ref Range: 20.0 - 28.0 mmol/L 23.9  O2 Saturation Latest Units: % 100.0  Patient temperature Unknown 98.5 F  Collection site Unknown Radial

## 2021-04-01 NOTE — Progress Notes (Signed)
Patient transported to CT and back to trauma room B without complications.

## 2021-04-01 NOTE — Progress Notes (Signed)
EEG complete - results pending 

## 2021-04-01 NOTE — Discharge Summary (Signed)
Physician Discharge Summary  Bobby Mathis PNT:[id number] DOB: Nov 22, 1995 DOA: 04/01/2021  PCP: Gilford Silvius, MD  Admit date: 04/01/2021 Discharge date: 04/01/2021   Discharge Diagnoses/Plan: Principal Problem:   Seizure disorder [facility]) Active Problems:   Status epilepticus ([place])    Seizure disorder/ Status epilepticus: Keppra to 1500 mg twice daily, and order lorazepam liquid 4 mg to be placed in the cheek for seizure ongoing greater than 5 minutes.  REFRAIN FROM DRIVING OR OPERATING HEAVY MACHINERY.  REPORT SEIZURE TO DMV. F/U WITH PCP AND HAVE LABS REPEATED PARTICULARLY CBC , KIDNEY FUNCTION.   Discharge Condition:  Stable  Diet recommendation:  Regular diet.  Filed Weights   04/01/21 0925  Weight: 80 kg    Discharge activity: As tolerated   History of present illness:  Pt admitted with witnessed seizure activity and status epilepticus for Mri and Neurology evaluation.   Hospital Course:  Pt admitted by hospitalist  team and within few hours received note taht pt wants to leave AMA. Dr. Curly [place] Neurology on case made aware and plan is to leave with med changes with keppra  and REFRAIN FROM DRIVING AND REPORTS SEIZURE TO DMV.    Procedures:  eeg  Consultations:  Neurology.  Discharge Exam: Vitals:   04/01/21 1645 04/01/21 1700  BP: 121/74 125/78  Pulse: 61 80  Resp:  (!) 31  Temp:    SpO2: 100% 100%    Physical Exam Vitals and nursing note reviewed.  Constitutional:      General: He is not in acute distress.    Appearance: Normal appearance. He is not ill-appearing or diaphoretic.  HENT:     Head: Normocephalic and atraumatic.     Right Ear: External ear normal.     Left Ear: External ear normal.     Mouth/Throat:     Mouth: Mucous membranes are moist.  Eyes:     Extraocular Movements: Extraocular movements intact.     Pupils: Pupils are equal, round, and reactive to light.  Cardiovascular:     Rate and Rhythm: Normal rate and regular  rhythm.     Pulses: Normal pulses.     Heart sounds: Normal heart sounds.  Pulmonary:     Effort: Pulmonary effort is normal.     Breath sounds: Normal breath sounds.  Abdominal:     General: Bowel sounds are normal. There is no distension.     Palpations: Abdomen is soft.     Tenderness: There is no abdominal tenderness. There is no guarding.  Musculoskeletal:     Right lower leg: No edema.     Left lower leg: No edema.  Neurological:     General: No focal deficit present.     Mental Status: He is alert and oriented to person, place, and time.  Psychiatric:        Attention and Perception: He is inattentive.        Speech: Speech normal.        Behavior: Behavior is withdrawn. Behavior is cooperative.     Physical Exam Vitals and nursing note reviewed.  Constitutional:      General: He is not in acute distress.    Appearance: Normal appearance. He is not ill-appearing or diaphoretic.  HENT:     Head: Normocephalic and atraumatic.     Right Ear: External ear normal.     Left Ear: External ear normal.     Mouth/Throat:     Mouth: Mucous membranes are moist.  Eyes:  Extraocular Movements: Extraocular movements intact.     Pupils: Pupils are equal, round, and reactive to light.  Cardiovascular:     Rate and Rhythm: Normal rate and regular rhythm.     Pulses: Normal pulses.     Heart sounds: Normal heart sounds.  Pulmonary:     Effort: Pulmonary effort is normal.     Breath sounds: Normal breath sounds.  Abdominal:     General: Bowel sounds are normal. There is no distension.     Palpations: Abdomen is soft.     Tenderness: There is no abdominal tenderness. There is no guarding.  Musculoskeletal:     Right lower leg: No edema.     Left lower leg: No edema.  Neurological:     General: No focal deficit present.     Mental Status: He is alert and oriented to person, place, and time.  Psychiatric:        Attention and Perception: He is inattentive.        Speech:  Speech normal.        Behavior: Behavior is withdrawn. Behavior is cooperative.     Discharge Instructions   Discharge Instructions    (HEART FAILURE PATIENTS) Call MD:  Anytime you have any of the following symptoms: 1) 3 pound weight gain in 24 hours or 5 pounds in 1 week 2) shortness of breath, with or without a dry hacking cough 3) swelling in the hands, feet or stomach 4) if you have to sleep on extra pillows at night in order to breathe.   Complete by: As directed    Call MD for:  difficulty breathing, headache or visual disturbances   Complete by: As directed    Call MD for:  extreme fatigue   Complete by: As directed    Call MD for:  hives   Complete by: As directed    Call MD for:  persistant dizziness or light-headedness   Complete by: As directed    Call MD for:  persistant nausea and vomiting   Complete by: As directed    Call MD for:  severe uncontrolled pain   Complete by: As directed    Call MD for:  temperature >100.4   Complete by: As directed    Diet - low sodium heart healthy   Complete by: As directed    Discharge instructions   Complete by: As directed    F/U WITH PCP IN 7 DAYS. F/U WITH NEUROLOGY WITHIN 7 DAYS.   Increase activity slowly   Complete by: As directed      Allergies as of 04/01/2021      Reactions   Nsaids Other (See Comments)   Because the patient is taking Sprycel, he can take ONLY TYLENOL (with this)- No other NSAIDS   Penicillins Hives, Rash      Amoxicillin-pot Clavulanate Other (See Comments)   Reaction not confirmed      Medication List    TAKE these medications   acetaminophen 500 MG tablet Commonly known as: TYLENOL Take 500-1,000 mg by mouth every 6 (six) hours as needed for mild pain or headache.   levETIRAcetam 750 MG tablet Commonly known as: KEPPRA Take 2 tablets (1,500 mg total) by mouth 2 (two) times daily for 30 doses. What changed:   medication strength  how much to take  when to take this   Sprycel 100  MG tablet Generic drug: dasatinib Take 100 mg by mouth daily.      Allergies  Allergen Reactions  . Nsaids Other (See Comments)    Because the patient is taking Sprycel, he can take ONLY TYLENOL (with this)- No other NSAIDS  . Penicillins Hives and Rash       . Amoxicillin-Pot Clavulanate Other (See Comments)    Reaction not confirmed     The results of significant diagnostics from this hospitalization (including imaging, microbiology, ancillary and laboratory) are listed below for reference.    Significant Diagnostic Studies: CT Head Wo Contrast  Result Date: 04/01/2021 CLINICAL DATA:  Seizure.  Endotracheal intubation. EXAM: CT HEAD WITHOUT CONTRAST TECHNIQUE: Contiguous axial images were obtained from the base of the skull through the vertex without intravenous contrast. COMPARISON:  None. FINDINGS: Brain: No abnormality affects the brainstem or cerebellum. The right cerebral hemisphere is normal. Within the junction of the occipital lobe and the posteromedial temporal lobe on the left, immediately adjacent to the occipital horn of the left lateral ventricle, there is a cystic and solid mass with coarse calcifications, measuring approximately 2 cm in diameter. This is almost certainly the cause of the seizure in this case. 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 No evidence of hemorrhage, hydrocephalus or extra-axial collection Vascular: No abnormal vascular finding. Skull: Normal Sinuses/Orbits: Clear Other: None IMPRESSION: Approximately 2 cm in diameter cystic and solid mass with coarse calcifications at the junction of the left posterior temporal lobe and occipital lobe. 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 This could certainly be a cause of seizure. MRI with contrast recommended for further characterization Electronically Signed   By: Nelson Chimes M.D.   On: 04/01/2021 11:19   DG Chest Portable 1 View  Result  Date: 04/01/2021 CLINICAL DATA:  Intubation, post seizure EXAM: PORTABLE CHEST 1 VIEW COMPARISON:  Portable exam 0946 hours without priors for comparison FINDINGS: Tip of endotracheal tube projects 3.6 cm above carina. Nasogastric tube extends into stomach. Normal heart size, mediastinal contours, and pulmonary vascularity. Lungs clear. No pulmonary infiltrate, pleural effusion, pneumothorax, or osseous abnormality. IMPRESSION: No acute abnormalities. Electronically Signed   By: Lavonia Dana M.D.   On: 04/01/2021 09:57   EEG adult  Result Date: 04/01/2021 Lora Havens, MD     04/01/2021 11:46 AM Patient Name: Bobby Mathis MRN: [id number] Epilepsy Attending: Lora Havens Referring Physician/Provider: Dr Lesleigh Noe Date: 04/01/2021 Duration: 24.58 mins Patient history: 26 yo patient with known seizure disorder presenting with status epilepticus. EEG to evaluate for seizure. Level of alertness:  comatose AEDs during EEG study: propofol Technical aspects: This EEG study was done with scalp electrodes positioned according to the 10-20 International system of electrode placement. Electrical activity was acquired at a sampling rate of 500Hz  and reviewed with a high frequency filter of 70Hz  and a low frequency filter of 1Hz . EEG data were recorded continuously and digitally stored. Description: EEG showed continuous generalized 3 to 6 Hz theta-delta slowing admixed with 15-18Hz  generalized beta activity. Spikes were noted in left frontotemporal region. Hyperventilation and photic stimulation were not performed.   ABNORMALITY -Spike, left frontotemporal region - Continuous slow, generalized IMPRESSION: This study showed evidence of epileptogenicity arising from left frontotemporal region. Additionally, there is severe diffuse encephalopathy, nonspecific etiology but likely related to sedation. No seizures were seen throughout the recording. Lora Havens    Microbiology: Recent Results (from the past 240  hour(s))  Resp Panel by RT-PCR (Flu A&B, Covid) Urine, Clean Catch     Status: None   Collection Time: 04/01/21 11:15  AM   Specimen: Urine, Clean Catch; Nasopharyngeal(NP) swabs in vial transport medium  Result Value Ref Range Status   SARS Coronavirus 2 by RT PCR NEGATIVE NEGATIVE Final    Comment: (NOTE) SARS-CoV-2 target nucleic acids are NOT DETECTED.  The SARS-CoV-2 RNA is generally detectable in upper respiratory specimens during the acute phase of infection. The lowest concentration of SARS-CoV-2 viral copies this assay can detect is 138 copies/mL. A negative result does not preclude SARS-Cov-2 infection and should not be used as the sole basis for treatment or other patient management decisions. A negative result may occur with  improper specimen collection/handling, submission of specimen other than nasopharyngeal swab, presence of viral mutation(s) within the areas targeted by this assay, and inadequate number of viral copies(<138 copies/mL). A negative result must be combined with clinical observations, patient history, and epidemiological information. The expected result is Negative.  Fact Sheet for Patients:  EntrepreneurPulse.com.au  Fact Sheet for Healthcare Providers:  IncredibleEmployment.be  This test is no t yet approved or cleared by the [place] FDA and  has been authorized for detection and/or diagnosis of SARS-CoV-2 by FDA under an Emergency Use Authorization (EUA). This EUA will remain  in effect (meaning this test can be used) for the duration of the COVID-19 declaration under Section 564(b)(1) of the Act, 21 U.S.C.section 360bbb-3(b)(1), unless the authorization is terminated  or revoked sooner.       Influenza A by PCR NEGATIVE NEGATIVE Final   Influenza B by PCR NEGATIVE NEGATIVE Final    Comment: (NOTE) The Xpert Xpress SARS-CoV-2/FLU/RSV plus assay is intended as an aid in the diagnosis of influenza from  Nasopharyngeal swab specimens and should not be used as a sole basis for treatment. Nasal washings and aspirates are unacceptable for Xpert Xpress SARS-CoV-2/FLU/RSV testing.  Fact Sheet for Patients: EntrepreneurPulse.com.au  Fact Sheet for Healthcare Providers: IncredibleEmployment.be  This test is not yet approved or cleared by the [place] FDA and has been authorized for detection and/or diagnosis of SARS-CoV-2 by FDA under an Emergency Use Authorization (EUA). This EUA will remain in effect (meaning this test can be used) for the duration of the COVID-19 declaration under Section 564(b)(1) of the Act, 21 U.S.C. section 360bbb-3(b)(1), unless the authorization is terminated or revoked.  Performed at [facility], [place] [address] [place], [place] [address]      Labs: Basic Metabolic Panel: Recent Labs  Lab 04/01/21 0934 04/01/21 0950 04/01/21 1029  NA 136 138 139  K 4.2 4.2 4.3  CL 101 105  --   CO2 14*  --   --   GLUCOSE 260* 254*  --   BUN 9 10  --   CREATININE 1.47* 1.20  --   CALCIUM 9.0  --   --   MG 2.2  --   --   PHOS 7.0*  --   --    Liver Function Tests: Recent Labs  Lab 04/01/21 0934  AST 34  ALT 20  ALKPHOS 68  BILITOT 0.4  PROT 7.5  ALBUMIN 4.7   No results for input(s): LIPASE, AMYLASE in the last 168 hours. No results for input(s): AMMONIA in the last 168 hours. CBC: Recent Labs  Lab 04/01/21 0934 04/01/21 0950 04/01/21 1029  WBC 17.5*  --   --   NEUTROABS 8.6*  --   --   HGB 13.9 14.3 12.6*  HCT 43.5 42.0 37.0*  MCV 102.1*  --   --   PLT 314  --   --  Cardiac Enzymes: Recent Labs  Lab 04/01/21 0934  CKTOTAL 258   BNP: BNP (last 3 results) No results for input(s): BNP in the last 8760 hours.  ProBNP (last 3 results) No results for input(s): PROBNP in the last 8760 hours.  CBG: Recent Labs  Lab 04/01/21 0935  GLUCAP 223*    Time spent: 15 minutes  Signed:  Para Skeans MD.  [facility] 04/01/2021, 6:23 PM

## 2021-04-01 NOTE — ED Notes (Signed)
Fentanyl and propofol drip wasted in sharps container with Babs Bertin, RN

## 2021-04-01 NOTE — ED Notes (Signed)
Sedation turned off per critical care with plan to extubate.

## 2021-04-01 NOTE — Procedures (Signed)
Extubation Procedure Note  Patient Details:   Name: Bobby Mathis DOB: 1996/04/24 MRN: [id number]   Airway Documentation:    Vent end date: 04/01/21 Vent end time: 1215   Evaluation  O2 sats: stable throughout Complications: No apparent complications Patient did tolerate procedure well. Bilateral Breath Sounds: Rhonchi   Yes   Patient extubated per MD order due to patient waking up and disconnecting from ventilator.  Positive cuff leak noted.  Patient did speak post extubation.  Patient currently 100% on room air.  No complications noted at this time.  Will continue to monitor.   Judith Part 04/01/2021, 12:19 PM

## 2021-04-02 NOTE — Progress Notes (Signed)
Nurse phoned EEG dept. Patient requested to leave, [employer] was not available to remove leads, Staff informed nurse that leads could be removed easy , due to just being tape. And to place machine in hall.   Machine was recovered from ER.

## 2021-04-03 LAB — LEVETIRACETAM LEVEL: Levetiracetam Lvl: <1 ug/mL — ABNORMAL LOW (ref 10.0–40.0)

## 2022-07-13 IMAGING — CT CT HEAD W/O CM
5 of 6 series · 15 of 47 positions shown, 16 images · non-contrast
Comparison: None.

CLINICAL DATA: Seizure.  Endotracheal intubation.

EXAM:
CT HEAD WITHOUT CONTRAST
TECHNIQUE: Contiguous axial images were obtained from the base of the skull
through the vertex without intravenous contrast.

[Series 3: head without · axial · non-contrast · 0.48mm/px · z∈[-154,-94]mm · 3 of 26 slices shown, 4 images (1 of 2)]
[im 7/26  brain]
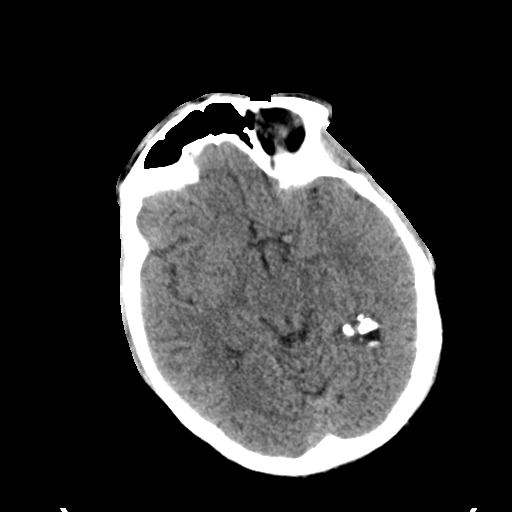
[im 7/26  bone]
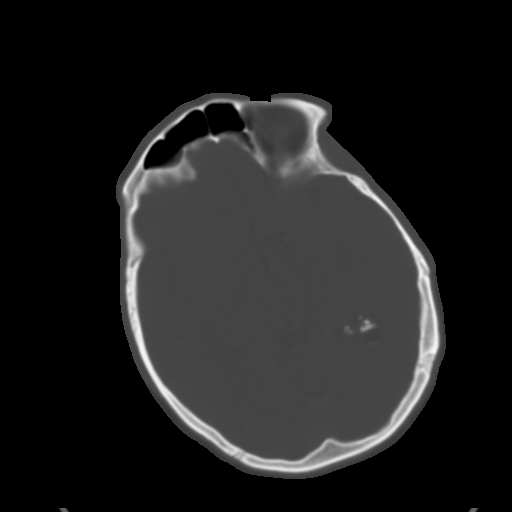
[im 13/26  brain]
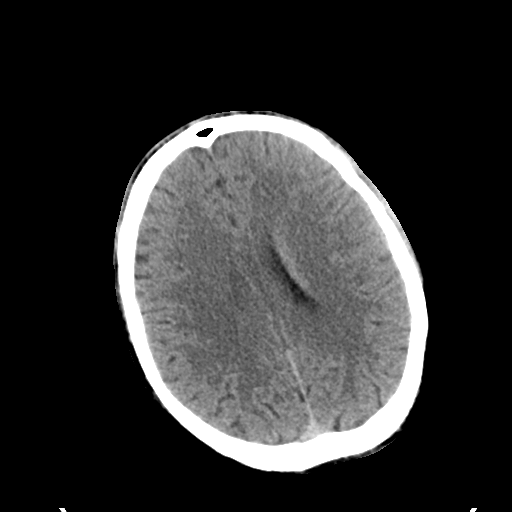
[im 19/26  brain]
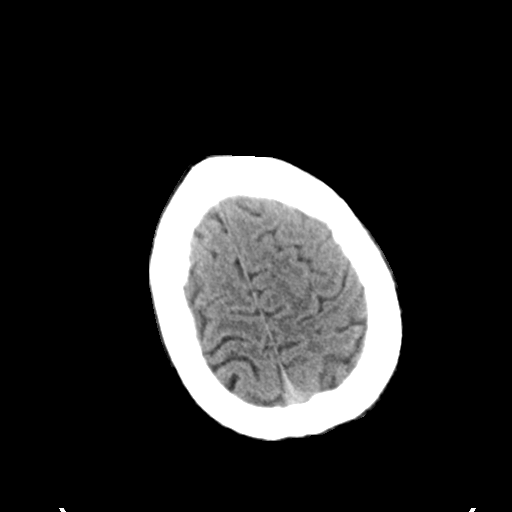

[Series 4: head without · axial · non-contrast · 0.48mm/px · z∈[-186,-156]mm · 2 of 20 slices shown (2 of 2)]
[im 7/20  brain]
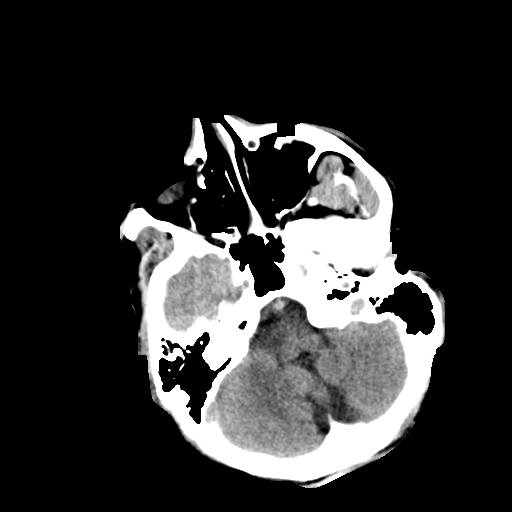
[im 13/20  brain]
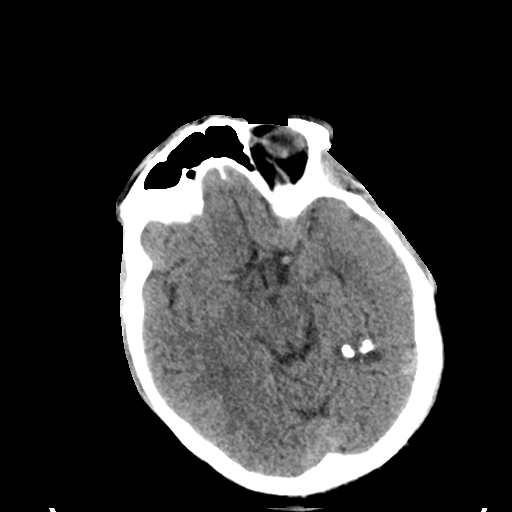

[Series 5: head bone · axial · 0.48mm/px · z∈[-174,-126]mm · 4 of 65 slices shown]
[im 6/65  bone]
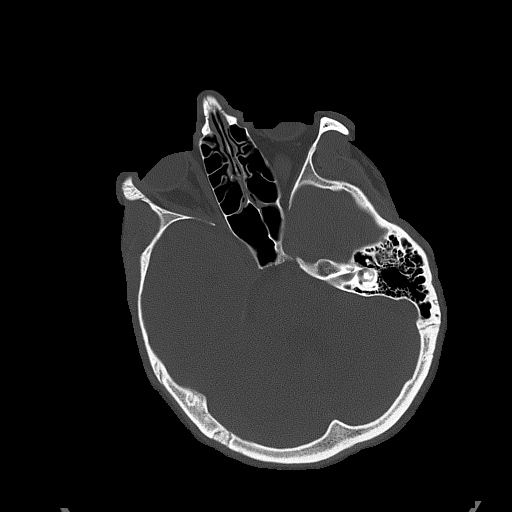
[im 12/65  bone]
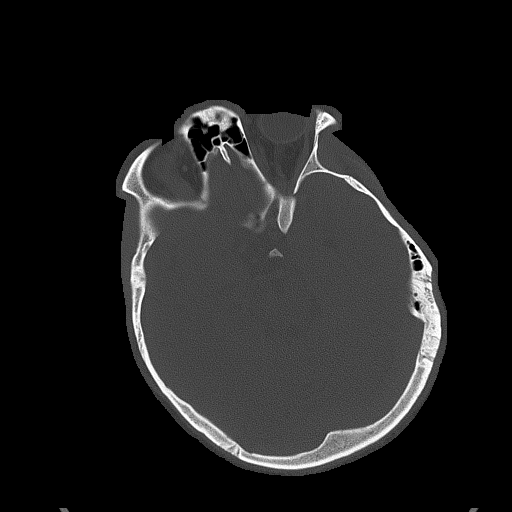
[im 24/65  bone]
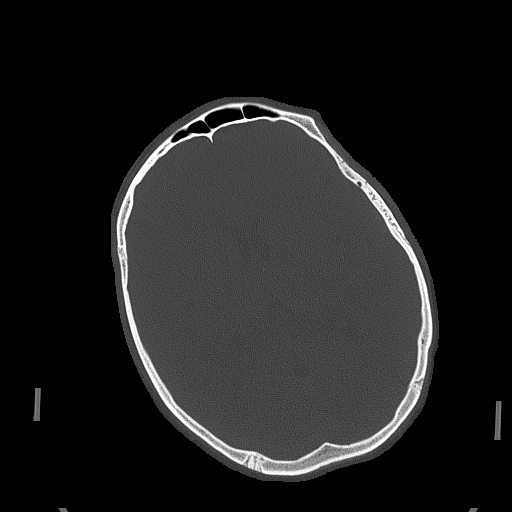
[im 30/65  bone]
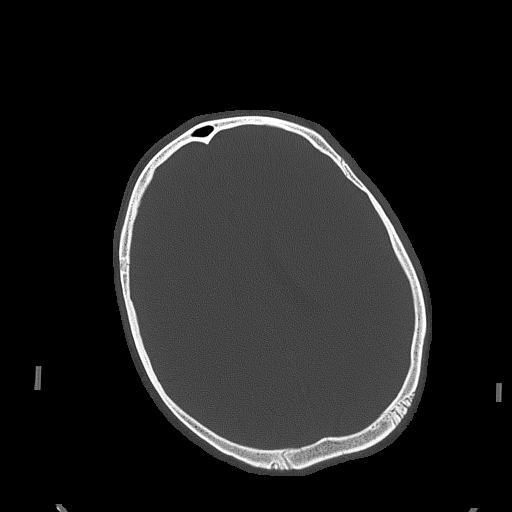

[Series 7: head without sag · sagittal · non-contrast · 0.27mm/px · 3 of 67 slices shown]
[im 24/67  brain]
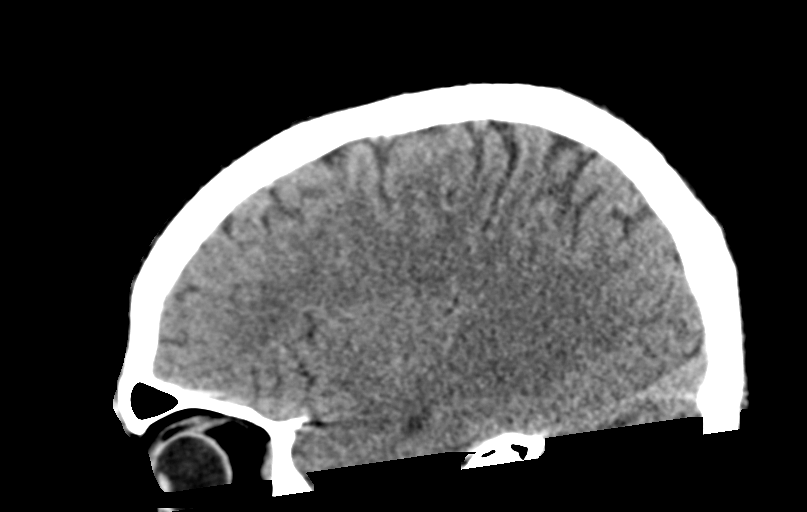
[im 34/67  brain]
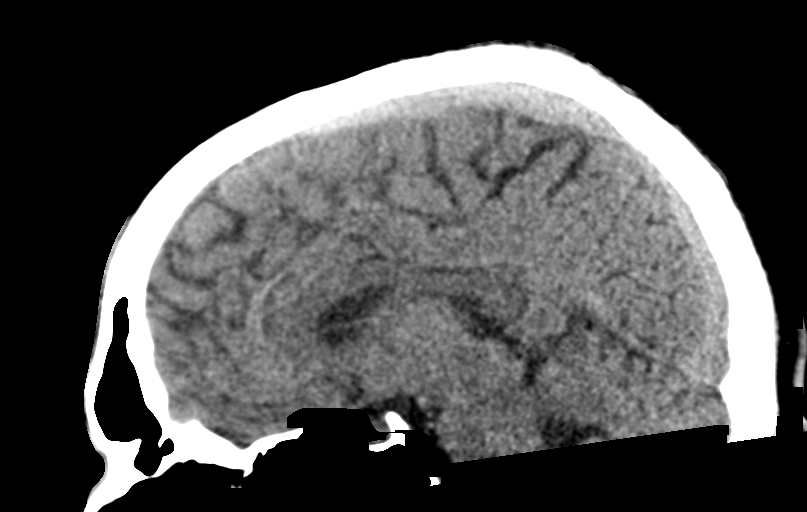
[im 43/67  brain]
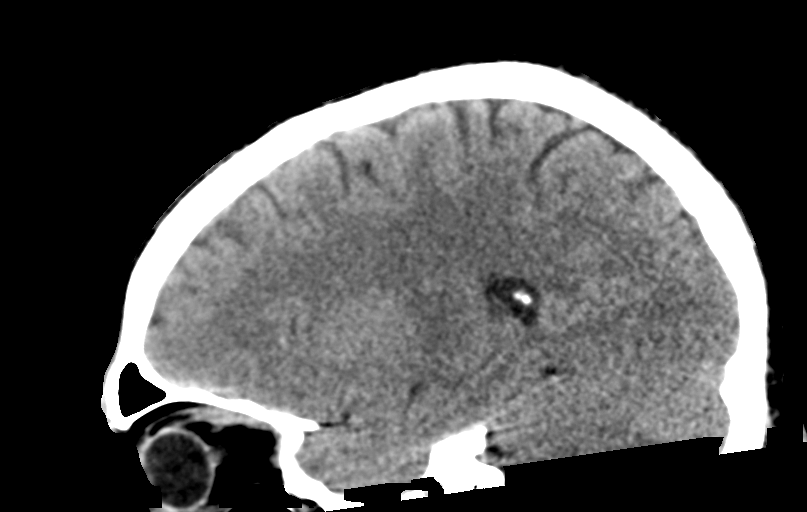

[Series 9: head without cor · coronal · non-contrast · 0.23mm/px · 3 of 70 slices shown]
[im 24/70  brain]
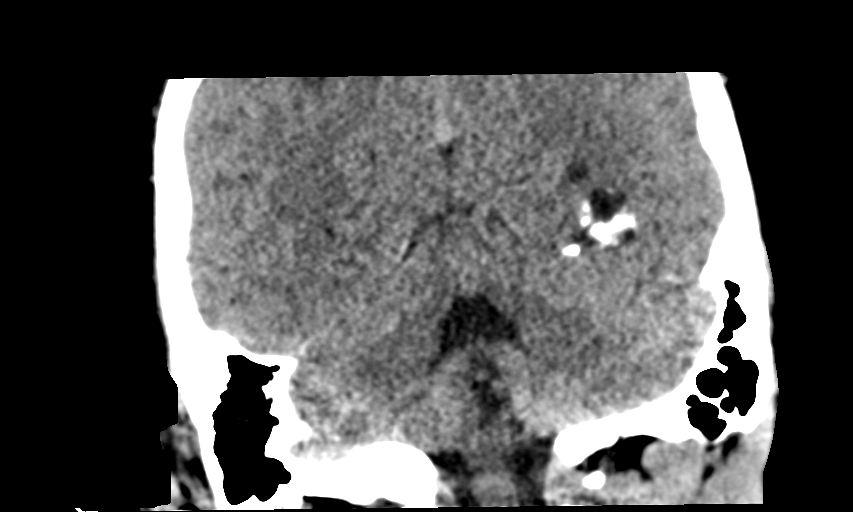
[im 31/70  brain]
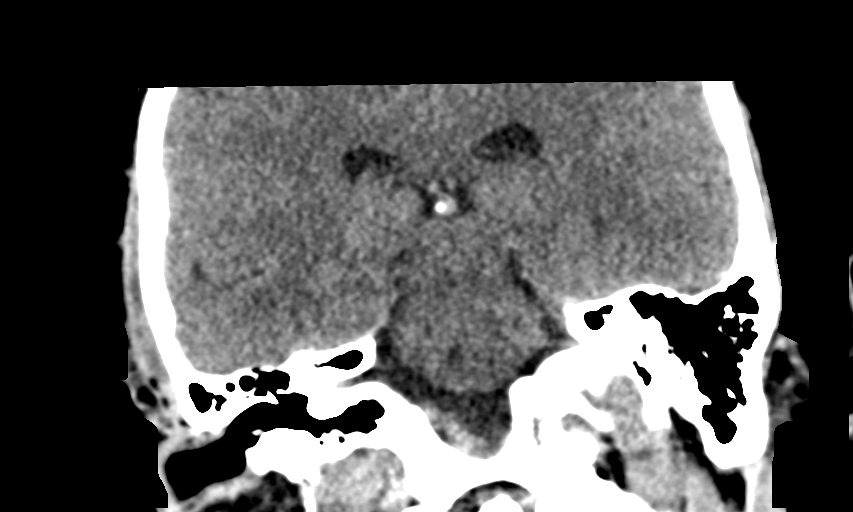
[im 39/70  brain]
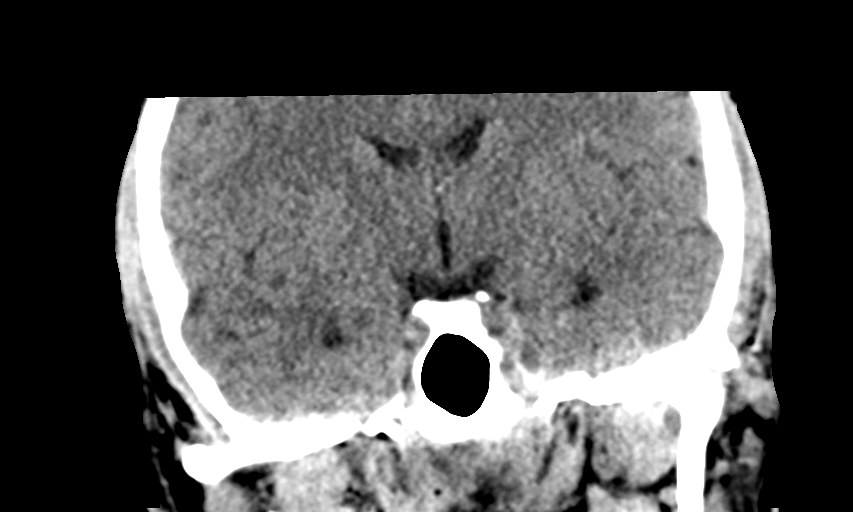

[15 of 47 positions shown; findings below may reference images not displayed]

FINDINGS: Brain: No abnormality affects the brainstem or cerebellum. The right
cerebral hemisphere is normal. Within the junction of the occipital
lobe and the posteromedial temporal lobe on the left, immediately
adjacent to the occipital horn of the left lateral ventricle, there
is a cystic and solid mass with coarse calcifications, measuring
approximately 2 cm in diameter. This is almost certainly the cause
of the seizure in this case. Most likely diagnosis would be
oligodendroglioma, but MRI is recommended for further
characterization. No evidence of hemorrhage, hydrocephalus or
extra-axial collection

Vascular: No abnormal vascular finding.

Skull: Normal

Sinuses/Orbits: Clear

Other: None
IMPRESSION: Approximately 2 cm in diameter cystic and solid mass with coarse
calcifications at the junction of the left posterior temporal lobe
and occipital lobe. Most likely diagnosis is oligodendroglioma. This
could certainly be a cause of seizure. MRI with contrast recommended
for further characterization

## 2022-07-13 IMAGING — DX DG CHEST 1V PORT
1 series · 1 of 1 positions shown · non-contrast
Comparison: Portable exam 7084 hours without priors for comparison

CLINICAL DATA: Intubation, post seizure

EXAM:
PORTABLE CHEST 1 VIEW

[chest ap]
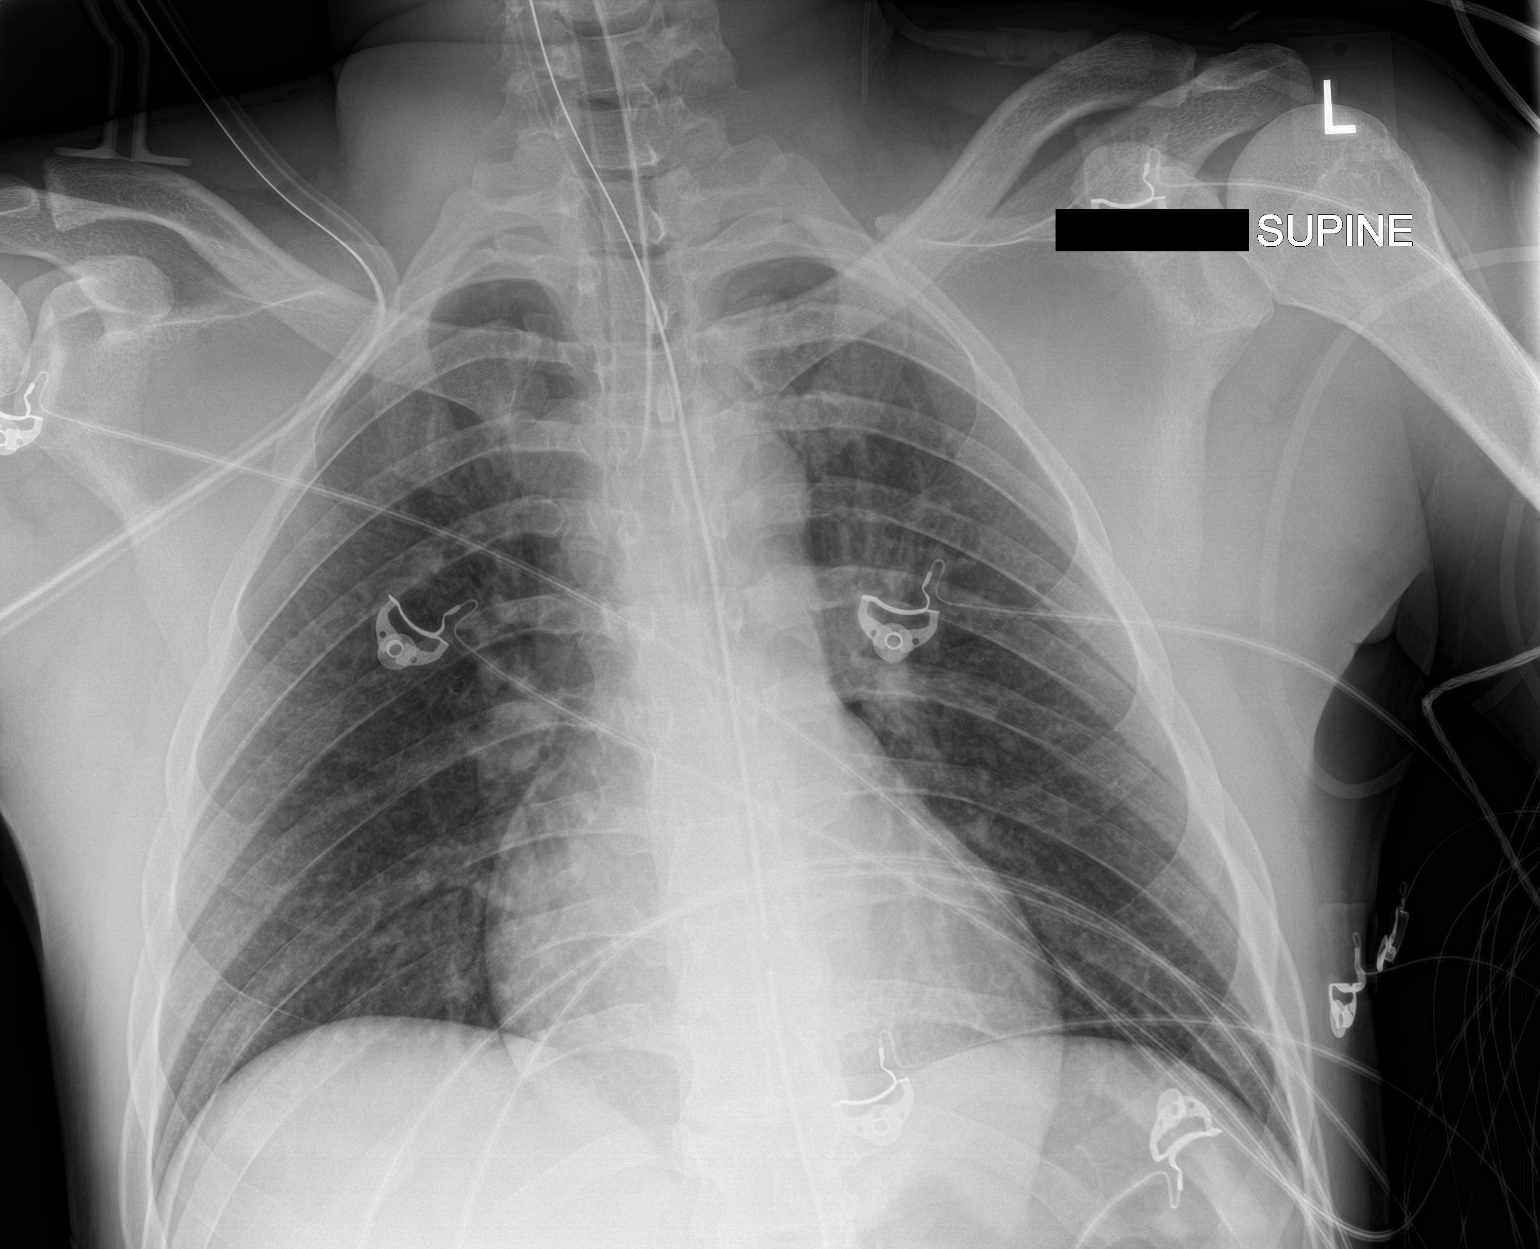

[1 of 1 positions shown; findings below may reference images not displayed]

FINDINGS: Tip of endotracheal tube projects 3.6 cm above carina.

Nasogastric tube extends into stomach.

Normal heart size, mediastinal contours, and pulmonary vascularity.

Lungs clear.

No pulmonary infiltrate, pleural effusion, pneumothorax, or osseous
abnormality.
IMPRESSION: No acute abnormalities.
